# Patient Record
Sex: Male | Born: 1980 | Race: White | Hispanic: No | Marital: Single | Smoking: Former smoker
Health system: Southern US, Community
[De-identification: ages and names within clinical notes are randomized; demographics above are authoritative.]

## PROBLEM LIST (undated history)

## (undated) DIAGNOSIS — M109 Gout, unspecified: Secondary | ICD-10-CM

## (undated) DIAGNOSIS — F17200 Nicotine dependence, unspecified, uncomplicated: Secondary | ICD-10-CM

## (undated) DIAGNOSIS — K529 Noninfective gastroenteritis and colitis, unspecified: Secondary | ICD-10-CM

## (undated) DIAGNOSIS — Z8489 Family history of other specified conditions: Secondary | ICD-10-CM

## (undated) HISTORY — DX: Nicotine dependence, unspecified, uncomplicated: F17.200

## (undated) HISTORY — DX: Gout, unspecified: M10.9

## (undated) HISTORY — DX: Noninfective gastroenteritis and colitis, unspecified: K52.9

---

## 1999-03-19 ENCOUNTER — Emergency Department (HOSPITAL_COMMUNITY): Admission: EM | Admit: 1999-03-19 | Discharge: 1999-03-19 | Payer: Self-pay | Admitting: Emergency Medicine

## 1999-03-21 ENCOUNTER — Emergency Department (HOSPITAL_COMMUNITY): Admission: EM | Admit: 1999-03-21 | Discharge: 1999-03-21 | Payer: Self-pay | Admitting: Emergency Medicine

## 2004-02-12 ENCOUNTER — Emergency Department: Payer: Self-pay | Admitting: Emergency Medicine

## 2004-10-11 ENCOUNTER — Emergency Department (HOSPITAL_COMMUNITY): Admission: EM | Admit: 2004-10-11 | Discharge: 2004-10-11 | Payer: Self-pay | Admitting: Emergency Medicine

## 2009-01-15 ENCOUNTER — Emergency Department (HOSPITAL_BASED_OUTPATIENT_CLINIC_OR_DEPARTMENT_OTHER): Admission: EM | Admit: 2009-01-15 | Discharge: 2009-01-16 | Payer: Self-pay | Admitting: Emergency Medicine

## 2009-01-15 ENCOUNTER — Ambulatory Visit: Payer: Self-pay | Admitting: Diagnostic Radiology

## 2010-07-02 ENCOUNTER — Emergency Department (HOSPITAL_COMMUNITY)
Admission: EM | Admit: 2010-07-02 | Discharge: 2010-07-03 | Disposition: A | Discharge status: Home / Self Care | Payer: Self-pay | Attending: Emergency Medicine | Admitting: Emergency Medicine

## 2010-07-02 ENCOUNTER — Emergency Department (HOSPITAL_COMMUNITY): Payer: Self-pay

## 2010-07-02 DIAGNOSIS — M25579 Pain in unspecified ankle and joints of unspecified foot: Secondary | ICD-10-CM | POA: Insufficient documentation

## 2010-07-02 DIAGNOSIS — M25473 Effusion, unspecified ankle: Secondary | ICD-10-CM | POA: Insufficient documentation

## 2010-07-02 DIAGNOSIS — M25476 Effusion, unspecified foot: Secondary | ICD-10-CM | POA: Insufficient documentation

## 2010-07-02 DIAGNOSIS — R609 Edema, unspecified: Secondary | ICD-10-CM | POA: Insufficient documentation

## 2010-07-02 DIAGNOSIS — Z862 Personal history of diseases of the blood and blood-forming organs and certain disorders involving the immune mechanism: Secondary | ICD-10-CM | POA: Insufficient documentation

## 2010-07-02 DIAGNOSIS — Z8639 Personal history of other endocrine, nutritional and metabolic disease: Secondary | ICD-10-CM | POA: Insufficient documentation

## 2010-08-08 LAB — DIFFERENTIAL
Eosinophils Absolute: 0 10*3/uL (ref 0.0–0.7)
Lymphs Abs: 1.3 10*3/uL (ref 0.7–4.0)
Monocytes Absolute: 2.3 10*3/uL — ABNORMAL HIGH (ref 0.1–1.0)
Neutrophils Relative %: 85 % — ABNORMAL HIGH (ref 43–77)

## 2010-08-08 LAB — CBC
MCHC: 34 g/dL (ref 30.0–36.0)
RDW: 14.4 % (ref 11.5–15.5)

## 2010-08-08 LAB — BASIC METABOLIC PANEL
BUN: 12 mg/dL (ref 6–23)
CO2: 28 mEq/L (ref 19–32)
Calcium: 9.5 mg/dL (ref 8.4–10.5)
Creatinine, Ser: 1.4 mg/dL (ref 0.4–1.5)
Glucose, Bld: 111 mg/dL — ABNORMAL HIGH (ref 70–99)

## 2011-11-20 ENCOUNTER — Emergency Department (HOSPITAL_COMMUNITY): Payer: BC Managed Care – PPO

## 2011-11-20 ENCOUNTER — Emergency Department (HOSPITAL_COMMUNITY)
Admission: EM | Admit: 2011-11-20 | Discharge: 2011-11-20 | Disposition: A | Discharge status: Home / Self Care | Payer: BC Managed Care – PPO | Attending: Emergency Medicine | Admitting: Emergency Medicine

## 2011-11-20 ENCOUNTER — Encounter (HOSPITAL_COMMUNITY): Payer: Self-pay

## 2011-11-20 DIAGNOSIS — G8929 Other chronic pain: Secondary | ICD-10-CM | POA: Insufficient documentation

## 2011-11-20 DIAGNOSIS — R197 Diarrhea, unspecified: Secondary | ICD-10-CM | POA: Insufficient documentation

## 2011-11-20 DIAGNOSIS — R509 Fever, unspecified: Secondary | ICD-10-CM

## 2011-11-20 DIAGNOSIS — R109 Unspecified abdominal pain: Secondary | ICD-10-CM | POA: Insufficient documentation

## 2011-11-20 LAB — CBC WITH DIFFERENTIAL/PLATELET
Basophils Absolute: 0 10*3/uL (ref 0.0–0.1)
Basophils Relative: 0 % (ref 0–1)
Eosinophils Relative: 1 % (ref 0–5)
Lymphocytes Relative: 10 % — ABNORMAL LOW (ref 12–46)
MCHC: 33.4 g/dL (ref 30.0–36.0)
MCV: 87.8 fL (ref 78.0–100.0)
Platelets: 307 10*3/uL (ref 150–400)
RDW: 13.8 % (ref 11.5–15.5)
WBC: 12.4 10*3/uL — ABNORMAL HIGH (ref 4.0–10.5)

## 2011-11-20 LAB — LACTIC ACID, PLASMA: Lactic Acid, Venous: 1.7 mmol/L (ref 0.5–2.2)

## 2011-11-20 LAB — COMPREHENSIVE METABOLIC PANEL
ALT: 25 U/L (ref 0–53)
AST: 22 U/L (ref 0–37)
Albumin: 3.9 g/dL (ref 3.5–5.2)
CO2: 24 mEq/L (ref 19–32)
Calcium: 9.3 mg/dL (ref 8.4–10.5)
Creatinine, Ser: 1.05 mg/dL (ref 0.50–1.35)
GFR calc non Af Amer: >90 mL/min (ref >90)
Sodium: 135 mEq/L (ref 135–145)
Total Protein: 7.6 g/dL (ref 6.0–8.3)

## 2011-11-20 MED ORDER — HYDROCODONE-ACETAMINOPHEN 5-500 MG PO TABS: 1.0000 | ORAL_TABLET | Freq: Four times a day (QID) | ORAL | Status: DC | PRN

## 2011-11-20 MED ORDER — IOHEXOL 300 MG/ML  SOLN
100.0000 mL | Freq: Once | INTRAMUSCULAR | Status: AC | PRN
  Administered 2011-11-20: 100 mL via INTRAVENOUS

## 2011-11-20 MED ORDER — ACETAMINOPHEN 500 MG PO TABS
1000.0000 mg | ORAL_TABLET | Freq: Once | ORAL | Status: AC
  Administered 2011-11-20: 1000 mg via ORAL
  Filled 2011-11-20: qty 2

## 2011-11-20 MED ORDER — METOCLOPRAMIDE HCL 10 MG PO TABS: 10.0000 mg | ORAL_TABLET | Freq: Four times a day (QID) | ORAL | Status: DC | PRN

## 2011-11-20 MED ORDER — IOHEXOL 300 MG/ML  SOLN
20.0000 mL | INTRAMUSCULAR | Status: AC
  Administered 2011-11-20 (×2): 20 mL via ORAL

## 2011-11-20 MED ORDER — SODIUM CHLORIDE 0.9 % IV BOLUS (SEPSIS)
1000.0000 mL | Freq: Once | INTRAVENOUS | Status: AC
  Administered 2011-11-20: 1000 mL via INTRAVENOUS

## 2011-11-20 NOTE — ED Notes (Signed)
Pt transported to and from CT scanner on stretcher with tech and tolerated well. 

## 2011-11-20 NOTE — ED Provider Notes (Signed)
History     CSN: [id number]  Arrival date & time 11/20/11  0102   First MD Initiated Contact with Patient 11/20/11 [phone]      Chief Complaint  Patient presents with  . Rectal Bleeding    (Consider location/radiation/quality/duration/timing/severity/associated sxs/prior treatment) HPI The patient presents with concerns of ongoing rectal bleeding and diarrhea.  He notes that for the past years he has had diarrhea.  Over the past 3 days his progressively become fatigued with diffuse discomfort.  He denies focal pain.  He states that he has had a change in his bowel movements from just diarrhea to play diarrhea.  He denies ongoing fevers, chills, lightheadedness, chest pain, dyspnea. The patient has never seen a gastroenterologist for his chronic diarrhea.  No past medical history on file.  No past surgical history on file.  No family history on file.  History  Substance Use Topics  . Smoking status: Never Smoker   . Smokeless tobacco: Not on file  . Alcohol Use: No      Review of Systems  Constitutional:       Per HPI, otherwise negative  HENT:       Per HPI, otherwise negative  Eyes: Negative.   Respiratory:       Per HPI, otherwise negative  Cardiovascular:       Per HPI, otherwise negative  Gastrointestinal: Negative for vomiting.  Genitourinary: Negative.   Musculoskeletal:       Per HPI, otherwise negative  Skin: Negative.   Neurological: Negative for syncope.    Allergies  Review of patient's allergies indicates no known allergies.  Home Medications   Current Outpatient Rx  Name Route Sig Dispense Refill  . HYDROCODONE-ACETAMINOPHEN 5-500 MG PO TABS Oral Take 1 tablet by mouth every 6 (six) hours as needed. pain    . MELOXICAM 7.5 MG PO TABS Oral Take 7.5 mg by mouth daily.    Marland Kitchen PHENYLEPHRINE-DM-GG-APAP 5-10-200-325 MG PO TABS Oral Take 1-2 tablets by mouth every 6 (six) hours as needed. Cold and cough.      BP 129/65  Pulse 90  Temp 99.8 F (37.7  C) (Oral)  Resp 18  SpO2 98%  Physical Exam  Nursing note and vitals reviewed. Constitutional: He is oriented to person, place, and time. He appears well-developed. No distress.  HENT:  Head: Normocephalic and atraumatic.  Eyes: Conjunctivae and EOM are normal.  Cardiovascular: Normal rate and regular rhythm.   Pulmonary/Chest: Effort normal. No stridor. No respiratory distress.  Abdominal: He exhibits no distension.  Genitourinary: Testes normal. Rectal exam shows external hemorrhoid. Guaiac negative stool.       Empty rectal vault, no material for Hemoccult.  No gross bleeding  Musculoskeletal: He exhibits no edema.  Neurological: He is alert and oriented to person, place, and time.  Skin: Skin is warm and dry.  Psychiatric: He has a normal mood and affect.    ED Course  Procedures (including critical care time)   Labs Reviewed  CBC WITH DIFFERENTIAL  COMPREHENSIVE METABOLIC PANEL  LIPASE, BLOOD  LACTIC ACID, PLASMA   No results found.   No diagnosis found.  Patient was sleeping on multiple re-evals.  He deferred requests for additional anti-emetics.   Pulse ox99%ra, normal  MDM  This generally well-appearing young male presents with concerns of ongoing abdominal pain, nausea, blood in his stool.  On my exam the patient is in no distress, awake, alert, interactive.  The patient does have a tender abdomen.  Given  the patient's description of years of diarrhea there is a suspicion of of irritable bowel disease.  The patient was sleeping following initial provision of antiemetics, analgesics.  The patient did not wish to drink his contrast material.  Noncontrast CT did not demonstrate acute findings.  The patient has a mild leukocytosis, mild fever, but is otherwise well appearing.  We discussed the need for close follow-up, with both PMD and GI to complete his evaluation of his acute on chronic abdominal pain and chronic diarrhea.    Gerhard Munch, MD 11/20/11  [phone]

## 2011-11-20 NOTE — ED Notes (Signed)
Body pain from head to toe, diarrhea now with blood, feels weak and has headache.

## 2011-11-20 NOTE — ED Notes (Addendum)
Pt attempting to drink contrast and refuses the remainder, states "it's not going to happen, I'll throw it all up".  Antiemetic medication offered, pt declined.

## 2011-11-20 NOTE — ED Notes (Signed)
Pt requesting to have strep screen st's he has had a sore throat x's 2 days.  Dr. Juleen China notified st's will order this.  Pt then refuse to let me swab his throat st's it will make him gag.  Dr. Juleen China made aware and rapid strep screen cancelled and pt discharged.

## 2011-11-24 ENCOUNTER — Encounter: Payer: Self-pay | Admitting: Internal Medicine

## 2011-11-25 ENCOUNTER — Encounter: Payer: Self-pay | Admitting: Internal Medicine

## 2011-11-25 ENCOUNTER — Other Ambulatory Visit (INDEPENDENT_AMBULATORY_CARE_PROVIDER_SITE_OTHER): Payer: BC Managed Care – PPO

## 2011-11-25 ENCOUNTER — Ambulatory Visit (INDEPENDENT_AMBULATORY_CARE_PROVIDER_SITE_OTHER): Payer: BC Managed Care – PPO | Admitting: Internal Medicine

## 2011-11-25 VITALS — BP 108/72 | HR 80 | Ht 68.0 in | Wt 189.0 lb

## 2011-11-25 DIAGNOSIS — K219 Gastro-esophageal reflux disease without esophagitis: Secondary | ICD-10-CM

## 2011-11-25 DIAGNOSIS — R197 Diarrhea, unspecified: Secondary | ICD-10-CM

## 2011-11-25 DIAGNOSIS — R109 Unspecified abdominal pain: Secondary | ICD-10-CM

## 2011-11-25 DIAGNOSIS — M109 Gout, unspecified: Secondary | ICD-10-CM | POA: Insufficient documentation

## 2011-11-25 DIAGNOSIS — K921 Melena: Secondary | ICD-10-CM

## 2011-11-25 LAB — IGA: IgA: 215 mg/dL (ref 68–378)

## 2011-11-25 LAB — TSH: TSH: 3.51 u[IU]/mL (ref 0.35–5.50)

## 2011-11-25 MED ORDER — ESOMEPRAZOLE MAGNESIUM 40 MG PO PACK: 40.0000 mg | PACK | Freq: Every day | ORAL | Status: DC

## 2011-11-25 NOTE — Progress Notes (Signed)
Patient ID: Robert Francis, male   DOB: 1981-02-04, 31 y.o.   MRN: [id number]  SUBJECTIVE: HPI Robert Francis is a 31 yo male with PMH of gout who is seen in consultation at the request of Dr. Prince Francis for evaluation of epigastric abdominal pain and diarrhea. The patient reports that he's had long-standing diarrhea and loose stools. This dates back greater than 5 years. He reports 5-6 loose stools a day, which are worse after eating. This is often associated with lower abdominal cramping which is relieved with defecation. A new problem for him was recently he experienced several days of blood mixed in his diarrhea. This has subsequently resolved. He recalls seeing blood on one occasion but this was remote. Occasionally the diarrhea wakes him from sleep but this is not common. He reports significant fecal urgency, but denies tenesmus. He also reports epigastric abdominal pain which is a near daily thing for him. He's also experiencing heartburn 3-4 days per week. He reports significant gas and bloating for which he has used simethicone with some relief. He does occasionally have nausea without vomiting. No dysphagia or odynophagia. Belching is not a big problem for him, though he feels that if he was able to belch it may help his pain. No fevers or chills recently, but a week or so ago he reports low-grade fevers.  He was recently seen in near and had a CT scan. He was prescribed metoclopramide for nausea but has not taken this.  Review of Systems  As per history of present illness, otherwise negative   Past Medical History  Diagnosis Date  . Gout     Current Outpatient Prescriptions  Medication Sig Dispense Refill  . HYDROcodone-acetaminophen (VICODIN) 5-500 MG per tablet Take 1 tablet by mouth every 6 (six) hours as needed. pain  12 tablet  0  . esomeprazole (NEXIUM) 40 MG packet Take 40 mg by mouth daily before breakfast.  30 each  12    No Known Allergies  Family History  Problem Relation Age of  Onset  . Colon cancer Neg Hx     History  Substance Use Topics  . Smoking status: Former Games developer  . Smokeless tobacco: Never Used  . Alcohol Use: No     Quit     OBJECTIVE: BP 108/72  Pulse 80  Ht 5\' 8"  (1.727 m)  Wt 189 lb (85.73 kg)  BMI 28.74 kg/m2 Constitutional: Well-developed and well-nourished. No distress. HEENT: Normocephalic and atraumatic. Oropharynx is clear and moist. No oropharyngeal exudate. Conjunctivae are normal. Pupils are equal round and reactive to light. No scleral icterus. Neck: Neck supple. Trachea midline. Cardiovascular: Normal rate, regular rhythm and intact distal pulses. No M/R/G Pulmonary/chest: Effort normal and breath sounds normal. No wheezing, rales or rhonchi. Abdominal: Soft, nontender, nondistended. Bowel sounds active throughout. There are no masses palpable. No hepatosplenomegaly. Extremities: no clubbing, cyanosis, or edema Lymphadenopathy: No cervical adenopathy noted. Neurological: Alert and oriented to person place and time. Skin: Skin is warm and dry. No rashes noted. Psychiatric: Normal mood and affect. Behavior is normal.  Labs and Imaging -- CT scan 11/20/2011 CT ABDOMEN AND PELVIS WITH CONTRAST   Technique:  Multidetector CT imaging of the abdomen and pelvis was performed following the standard protocol during bolus administration of intravenous contrast.   Contrast:  100 ml Omnipaque-300 IV   Comparison: None.   Findings: Mild dependent atelectasis/mosaic attenuation at the lung bases.   Liver, spleen, pancreas, and adrenal glands are within normal limits.   Gallbladder  is underdistended.  No intrahepatic or extrahepatic ductal dilatation.   Bilateral renal cysts, largest measuring 1.5 cm in the right upper pole (series 2/image 30). Scattered areas of high density in the left > right renal collecting systems is favored to reflect early excretory contrast rather than nonobstructing calculi (series 2/image 33).  No  hydronephrosis.   No evidence of bowel obstruction.  Normal appendix.  No colonic wall thickening or inflammatory changes.   No evidence of abdominal aortic aneurysm.   No abdominopelvic ascites.   No suspicious abdominopelvic lymphadenopathy.   Prostate is unremarkable.   Bladder is within normal limits.   Small fat-containing left inguinal hernia.   Visualized osseous structures are within normal limits.   IMPRESSION: No colonic wall thickening or inflammatory changes.   No evidence of bowel obstruction.  Normal appendix.   No CT findings to account for the patient's abdominal pain.  CBC    Component Value Date/Time   WBC 12.4* 11/20/2011 1027   RBC 5.01 11/20/2011 1027   HGB 14.7 11/20/2011 1027   HCT 44.0 11/20/2011 1027   PLT 307 11/20/2011 1027   MCV 87.8 11/20/2011 1027   MCH 29.3 11/20/2011 1027   MCHC 33.4 11/20/2011 1027   RDW 13.8 11/20/2011 1027   LYMPHSABS 1.2 11/20/2011 1027   MONOABS 1.4* 11/20/2011 1027   EOSABS 0.2 11/20/2011 1027   BASOSABS 0.0 11/20/2011 1027    CMP     Component Value Date/Time   NA 135 11/20/2011 1027   K 4.6 11/20/2011 1027   CL 98 11/20/2011 1027   CO2 24 11/20/2011 1027   GLUCOSE 127* 11/20/2011 1027   BUN 8 11/20/2011 1027   CREATININE 1.05 11/20/2011 1027   CALCIUM 9.3 11/20/2011 1027   PROT 7.6 11/20/2011 1027   ALBUMIN 3.9 11/20/2011 1027   AST 22 11/20/2011 1027   ALT 25 11/20/2011 1027   ALKPHOS 104 11/20/2011 1027   BILITOT 0.2* 11/20/2011 1027   GFRNONAA >90 11/20/2011 1027   GFRAA >90 11/20/2011 1027    Lipase     Component Value Date/Time   LIPASE 28 11/20/2011 1027   ASSESSMENT AND PLAN: 31 yo male with PMH of gout who is seen in consultation at the request of Dr. Prince Francis for evaluation of epigastric abdominal pain and diarrhea.  1. Diarrhea/bloody diarrhea/lower abd pain --the patient has long-standing history of diarrhea and loose stools, however recently experienced several days of bloody diarrhea. He has had CT scan  which was unremarkable and this is reassuring. He did have a mild leukocytosis notable on CBC. CMP was rather unremarkable. First I would like to exclude infection as a source for his diarrhea. We have ordered stool studies today to include C. difficile, O. and P., fecal leukocytes and stool culture. I also would like to check a celiac panel and TSH. If this workup is unrevealing, then I have recommended proceeding with colonoscopy for further evaluation of his diarrhea and bleeding. If infectious workup is negative we can try loperamide as an antidiarrheal.  2. Epigastric pain/GERD/dyspepsia -- I'll prescribe Nexium 40 mg daily to help with his symptoms. If symptoms fail to respond completely with PPI, then we will likely pursue upper endoscopy at the same time as his colonoscopy. I've asked that he discontinue metoclopramide this likely will not help his diarrhea

## 2011-11-25 NOTE — Patient Instructions (Addendum)
Your physician has requested that you go to the basement for lab work before leaving today.  We have sent the following medications to your pharmacy for you to pick up at your convenience: Nexium  Discontinue Reglan  Upon review of your labs we will discuss colonoscopy

## 2011-11-26 LAB — TISSUE TRANSGLUTAMINASE, IGA: Tissue Transglutaminase Ab, IgA: 4.7 U/mL (ref <20)

## 2013-05-04 HISTORY — PX: WISDOM TOOTH EXTRACTION: SHX21

## 2014-04-08 IMAGING — CT CT ABD-PELV W/ CM
2 of 4 series · 17 of 46 positions shown, 19 images · IV contrast (100ml omni 300)
Comparison: None.

CLINICAL DATA: Abdominal pain, nausea, possible colitis

CT ABDOMEN AND PELVIS WITH CONTRAST
TECHNIQUE: Multidetector CT imaging of the abdomen and pelvis was
performed following the standard protocol during bolus
administration of intravenous contrast.
Contrast:  100 ml Imnipaque-VLL IV

[Series 2: routine abdomen · axial · 0.87mm/px · z∈[-472,-17]mm · 14 of 99 slices shown, 16 images]
[im 4/99  soft-tissue]
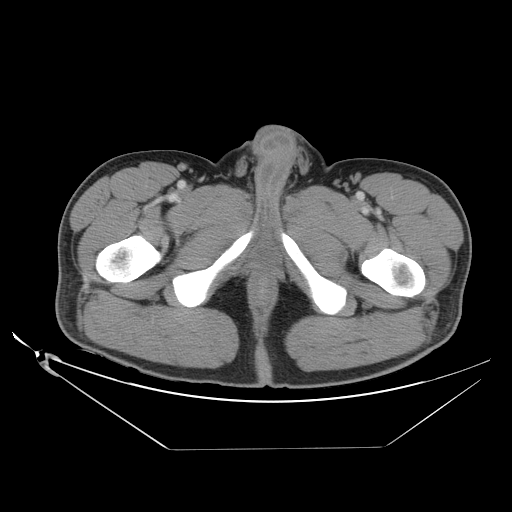
[im 4/99  bone]
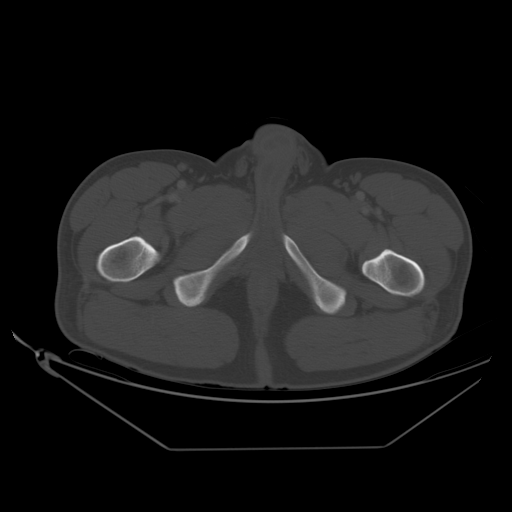
[im 12/99  soft-tissue]
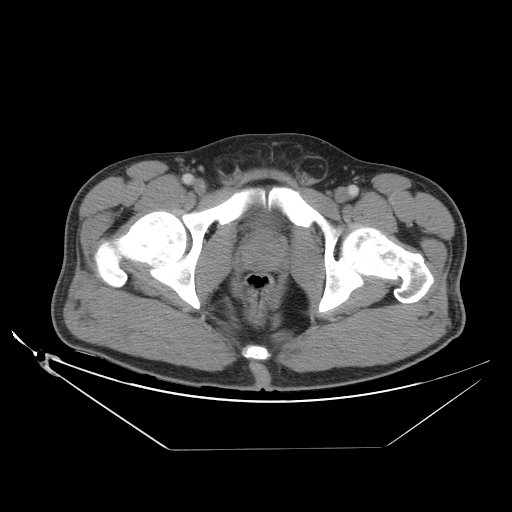
[im 20/99  soft-tissue]
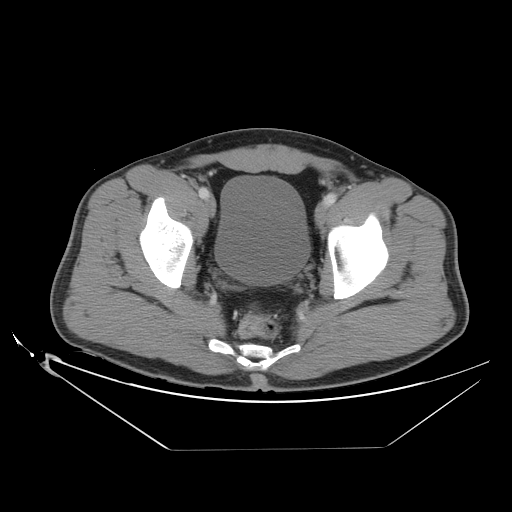
[im 28/99  soft-tissue]
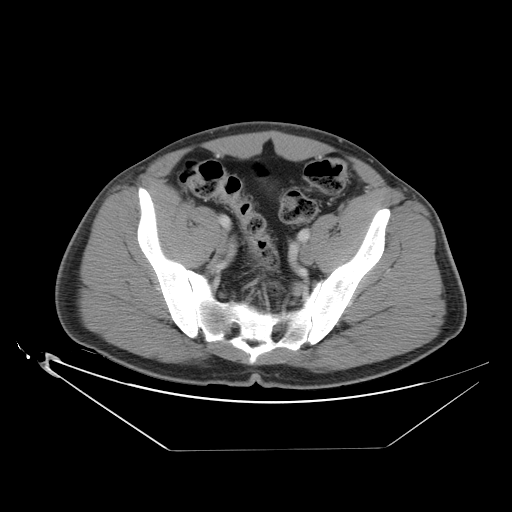
[im 32/99  soft-tissue]
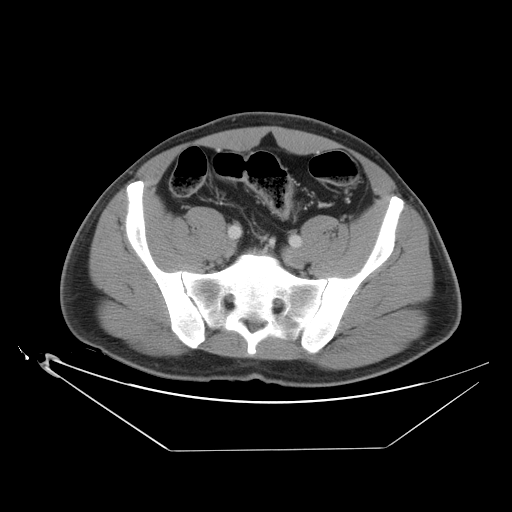
[im 40/99  soft-tissue]
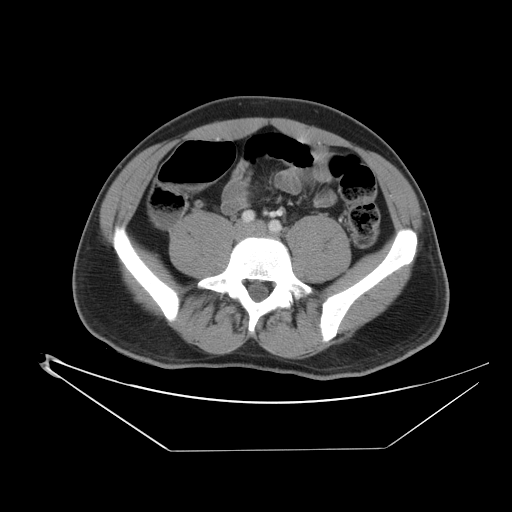
[im 48/99  soft-tissue]
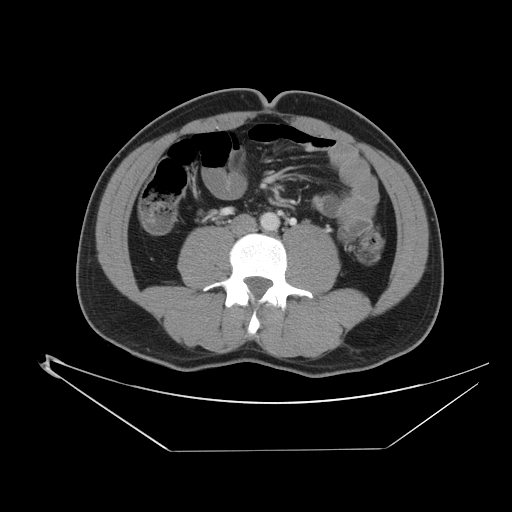
[im 51/99  soft-tissue]
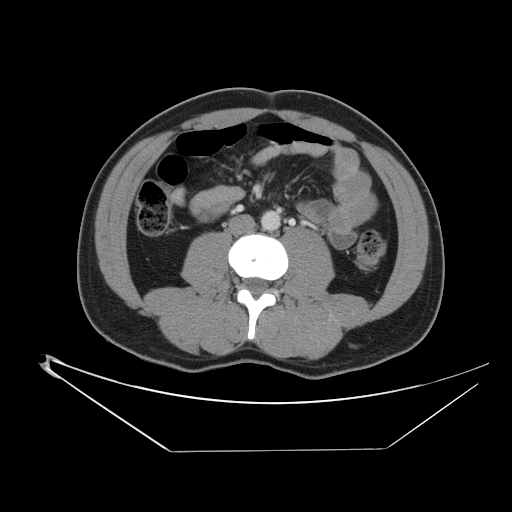
[im 59/99  soft-tissue]
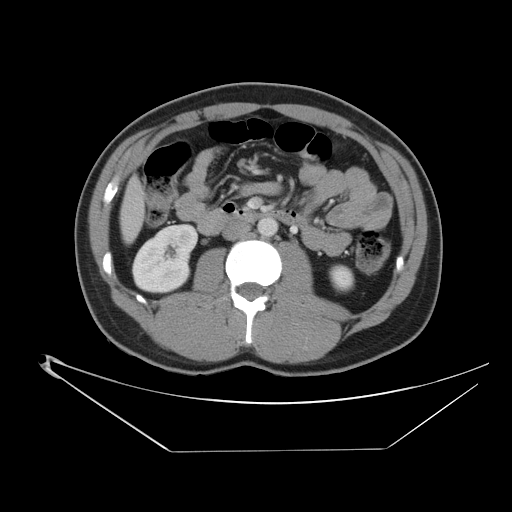
[im 59/99  bone]
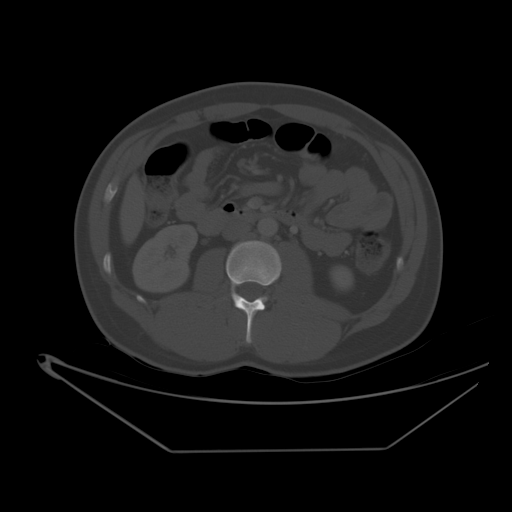
[im 67/99  soft-tissue]
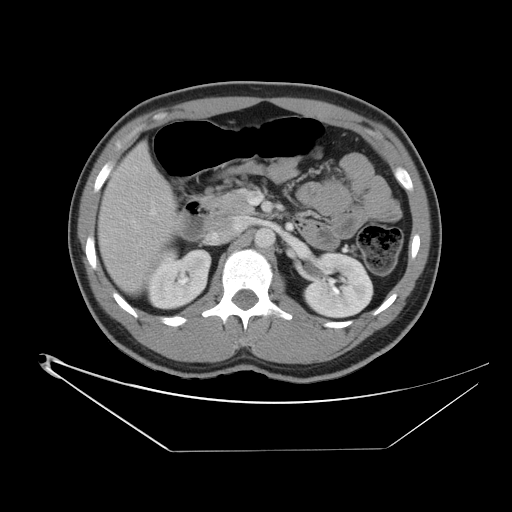
[im 75/99  soft-tissue]
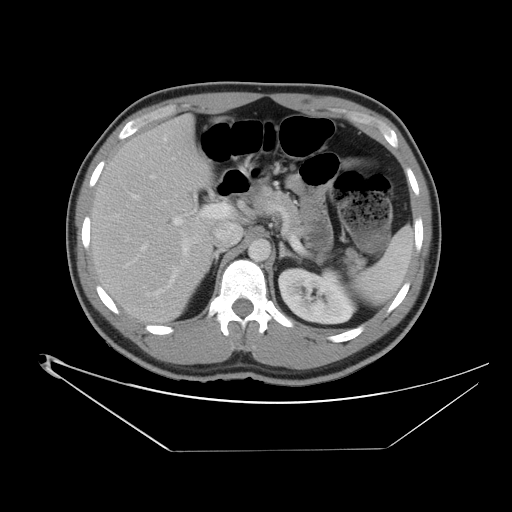
[im 79/99  soft-tissue]
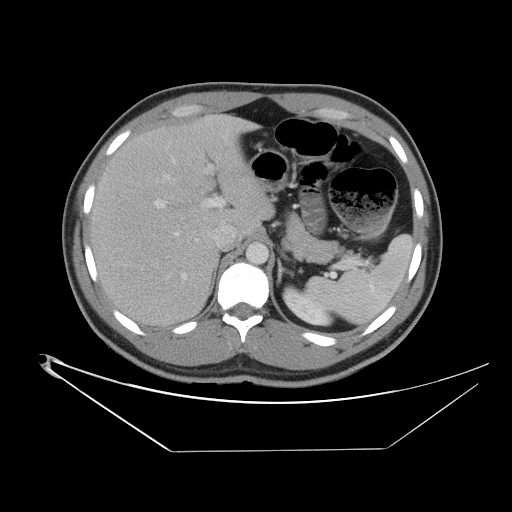
[im 87/99  soft-tissue]
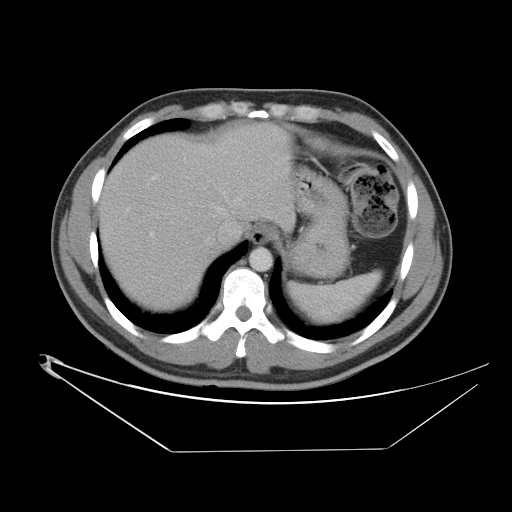
[im 95/99  soft-tissue]
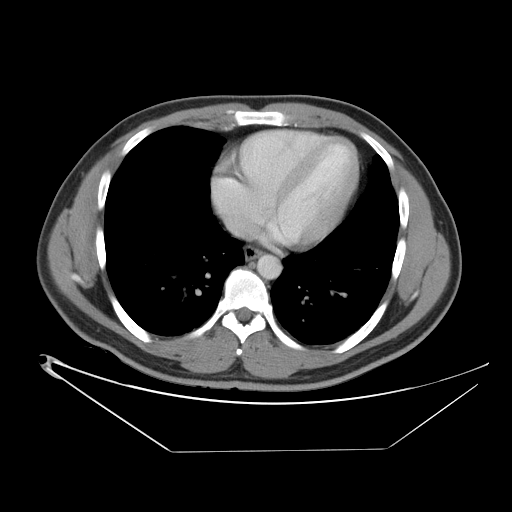

[Series 400: cor · coronal · 0.98mm/px · 3 of 86 slices shown]
[im 29/86  soft-tissue]
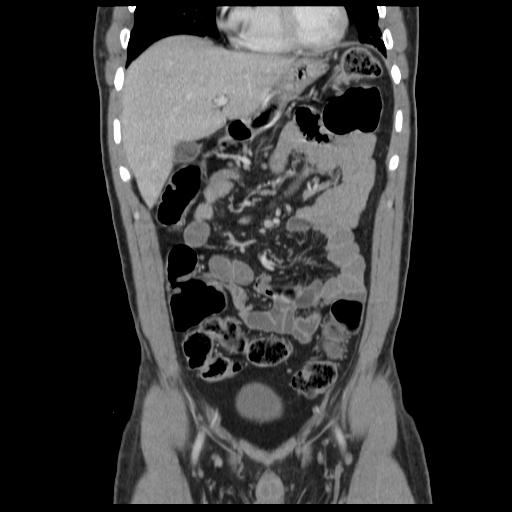
[im 38/86  soft-tissue]
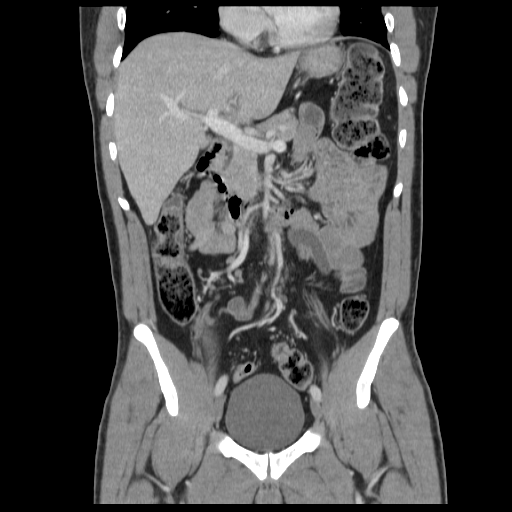
[im 48/86  soft-tissue]
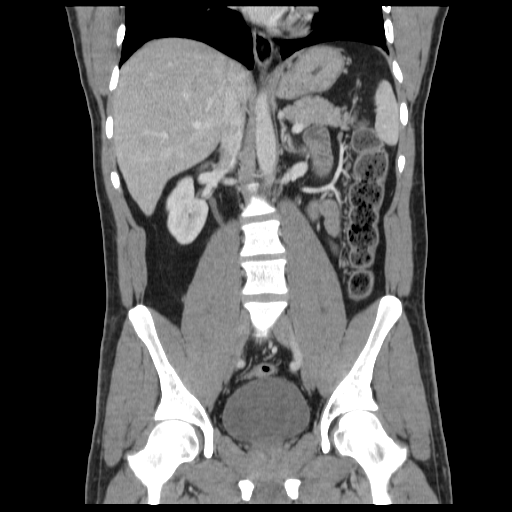

[17 of 46 positions shown; findings below may reference images not displayed]

FINDINGS: Mild dependent atelectasis/mosaic attenuation at the lung
bases.

Liver, spleen, pancreas, and adrenal glands are within normal
limits.

Gallbladder is underdistended.  No intrahepatic or extrahepatic
ductal dilatation.

Bilateral renal cysts, largest measuring 1.5 cm in the right upper
pole (series 2/image 30). Scattered areas of high density in the
left > right renal collecting systems is favored to reflect early
excretory contrast rather than nonobstructing calculi (series
2/image 33).  No hydronephrosis.

No evidence of bowel obstruction.  Normal appendix.  No colonic
wall thickening or inflammatory changes.

No evidence of abdominal aortic aneurysm.

No abdominopelvic ascites.

No suspicious abdominopelvic lymphadenopathy.

Prostate is unremarkable.

Bladder is within normal limits.

Small fat-containing left inguinal hernia.

Visualized osseous structures are within normal limits.
IMPRESSION: No colonic wall thickening or inflammatory changes.

No evidence of bowel obstruction.  Normal appendix.

No CT findings to account for the patient's abdominal pain.

## 2014-05-31 ENCOUNTER — Encounter: Payer: Self-pay | Admitting: *Deleted

## 2014-06-21 ENCOUNTER — Ambulatory Visit (INDEPENDENT_AMBULATORY_CARE_PROVIDER_SITE_OTHER): Payer: BLUE CROSS/BLUE SHIELD | Admitting: Family Medicine

## 2014-06-21 ENCOUNTER — Encounter: Payer: Self-pay | Admitting: Family Medicine

## 2014-06-21 VITALS — BP 130/72 | HR 78 | Temp 98.3°F | Resp 16 | Ht 68.0 in | Wt 189.0 lb

## 2014-06-21 DIAGNOSIS — K529 Noninfective gastroenteritis and colitis, unspecified: Secondary | ICD-10-CM

## 2014-06-21 DIAGNOSIS — M1000 Idiopathic gout, unspecified site: Secondary | ICD-10-CM

## 2014-06-21 DIAGNOSIS — Z0000 Encounter for general adult medical examination without abnormal findings: Secondary | ICD-10-CM

## 2014-06-21 DIAGNOSIS — F17200 Nicotine dependence, unspecified, uncomplicated: Secondary | ICD-10-CM | POA: Insufficient documentation

## 2014-06-21 LAB — CBC WITH DIFFERENTIAL/PLATELET
BASOS ABS: 0 10*3/uL (ref 0.0–0.1)
BASOS PCT: 0 % (ref 0–1)
EOS ABS: 0.2 10*3/uL (ref 0.0–0.7)
EOS PCT: 2 % (ref 0–5)
HCT: 43.1 % (ref 39.0–52.0)
Hemoglobin: 14.3 g/dL (ref 13.0–17.0)
Lymphocytes Relative: 13 % (ref 12–46)
Lymphs Abs: 1.5 10*3/uL (ref 0.7–4.0)
MCH: 26.8 pg (ref 26.0–34.0)
MCHC: 33.2 g/dL (ref 30.0–36.0)
MCV: 80.9 fL (ref 78.0–100.0)
MONO ABS: 1.4 10*3/uL — AB (ref 0.1–1.0)
MONOS PCT: 12 % (ref 3–12)
MPV: 9.4 fL (ref 8.6–12.4)
Neutro Abs: 8.2 10*3/uL — ABNORMAL HIGH (ref 1.7–7.7)
Neutrophils Relative %: 73 % (ref 43–77)
PLATELETS: 463 10*3/uL — AB (ref 150–400)
RBC: 5.33 MIL/uL (ref 4.22–5.81)
RDW: 16.4 % — ABNORMAL HIGH (ref 11.5–15.5)
WBC: 11.3 10*3/uL — AB (ref 4.0–10.5)

## 2014-06-21 LAB — COMPLETE METABOLIC PANEL WITH GFR
ALT: 18 U/L (ref 0–53)
AST: 14 U/L (ref 0–37)
Albumin: 3.9 g/dL (ref 3.5–5.2)
Alkaline Phosphatase: 103 U/L (ref 39–117)
BILIRUBIN TOTAL: 0.4 mg/dL (ref 0.2–1.2)
BUN: 10 mg/dL (ref 6–23)
CO2: 21 meq/L (ref 19–32)
CREATININE: 1.04 mg/dL (ref 0.50–1.35)
Calcium: 9 mg/dL (ref 8.4–10.5)
Chloride: 105 mEq/L (ref 96–112)
GFR, Est African American: >89 mL/min
GFR, Est Non African American: >89 mL/min
GLUCOSE: 79 mg/dL (ref 70–99)
Potassium: 5.1 mEq/L (ref 3.5–5.3)
Sodium: 139 mEq/L (ref 135–145)
TOTAL PROTEIN: 6.7 g/dL (ref 6.0–8.3)

## 2014-06-21 LAB — LIPID PANEL
Cholesterol: 191 mg/dL (ref 0–200)
HDL: 50 mg/dL (ref >39)
LDL CALC: 125 mg/dL — AB (ref 0–99)
TRIGLYCERIDES: 81 mg/dL (ref <150)
Total CHOL/HDL Ratio: 3.8 Ratio
VLDL: 16 mg/dL (ref 0–40)

## 2014-06-21 LAB — URIC ACID: Uric Acid, Serum: 9.2 mg/dL — ABNORMAL HIGH (ref 4.0–7.8)

## 2014-06-21 NOTE — Progress Notes (Signed)
Subjective:    Patient ID: Robert Francis, male    DOB: 1980-09-23, 34 y.o.   MRN: [id number]  HPI Patient is here today to establish care. He is also requesting a general medical exam. He has 2 chronic issues. #1 he has a history of gout. Patient has several gout exacerbations every year. He takes colchicine on an as needed basis which seems to work well. He would like a refill on colchicine as needed. He has never been tried on a preventative medicine even though he has frequent gout attacks. He also has a history of chronic diarrhea for several years. He was seen by gastroenterology in 2013 and at that time a celiac panel was negative, TSH was normal, and a CT scan was unremarkable. They have recommended a colonoscopy to rule out inflammatory bowel disease but this was apparently never done. He continues to have 5-7 watery bowel movements every day with occasional blood in his stool. He has no family history of inflammatory bowel disease. He denies any fevers or chills or weight loss or flulike symptoms. He also has a significant past medical history of smoking along with his significant family history of premature coronary artery disease. Past Medical History  Diagnosis Date  . Gout   . Smoker    No past surgical history on file. No current outpatient prescriptions on file prior to visit.   No current facility-administered medications on file prior to visit.   No Known Allergies History   Social History  . Marital Status: Single    Spouse Name: N/A  . Number of Children: N/A  . Years of Education: N/A   Occupational History  . Not on file.   Social History Main Topics  . Smoking status: Current Every Day Smoker -- 1.00 packs/day    Types: Cigarettes  . Smokeless tobacco: Never Used  . Alcohol Use: Yes     Comment: rarely  . Drug Use: No  . 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 Not on  file   Social History Narrative   Family History  Problem Relation Age of Onset  . Colon cancer Neg Hx   . Hypertension Mother   . Heart disease Maternal Uncle     40's  . Cancer Maternal Grandmother     breast  . Heart disease Maternal Grandfather     73's  . Cancer Paternal Grandfather     brain tumor      Review of Systems  All other systems reviewed and are negative.      Objective:   Physical Exam  Constitutional: He is oriented to person, place, and time. He appears well-developed and well-nourished. No distress.  HENT:  Head: Normocephalic and atraumatic.  Right Ear: External ear normal.  Left Ear: External ear normal.  Nose: Nose normal.  Mouth/Throat: Oropharynx is clear and moist. No oropharyngeal exudate.  Eyes: Conjunctivae and EOM are normal. Pupils are equal, round, and reactive to light. Right eye exhibits no discharge. Left eye exhibits no discharge. No scleral icterus.  Neck: Normal range of motion. Neck supple. No JVD present. No tracheal deviation present. No thyromegaly present.  Cardiovascular: Normal rate, regular rhythm, normal heart sounds and intact distal pulses.  Exam reveals no gallop and no friction rub.   No murmur heard. Pulmonary/Chest: Effort normal and breath sounds normal. No stridor. No respiratory distress. He has no wheezes. He has  no rales. He exhibits no tenderness.  Abdominal: Soft. Bowel sounds are normal. He exhibits no distension and no mass. There is no tenderness. There is no rebound and no guarding.  Musculoskeletal: Normal range of motion. He exhibits no edema or tenderness.  Lymphadenopathy:    He has no cervical adenopathy.  Neurological: He is alert and oriented to person, place, and time. He has normal reflexes. He displays normal reflexes. No cranial nerve deficit. He exhibits normal muscle tone. Coordination normal.  Skin: Skin is warm. No rash noted. He is not diaphoretic. No erythema. No pallor.  Psychiatric: He has  a normal mood and affect. His behavior is normal. Judgment and thought content normal.  Vitals reviewed.         Assessment & Plan:  Routine general medical examination at a health care facility - Plan: COMPLETE METABOLIC PANEL WITH GFR, CBC with Differential/Platelet, Lipid panel  Acute idiopathic gout, unspecified site - Plan: Uric acid  Colitis - Plan: Sedimentation rate  Chronic diarrhea  Physical exam is significant for smoking and tobacco abuse. I recommended cessation. I also believe the patient is having way too many gout exacerbations and this could lead to chronic joint problems later in life. I will check a uric acid level and if greater than 6 hour recommend starting allopurinol as it preventative medication. I'm fine giving the patient culture seen on an as needed basis for exacerbations but I believe we need to target and prevent exacerbations. I'm also concerned by his chronic diarrhea. I do think he needs to follow-up with GI for the colonoscopy to rule out inflammatory bowel disease. If the colonoscopy is negative at that time, we can focus treatment on irritable bowel syndrome diarrhea predominant with medications such as Xifaxan or Viberzi.

## 2014-06-22 ENCOUNTER — Other Ambulatory Visit: Payer: Self-pay | Admitting: Family Medicine

## 2014-06-22 ENCOUNTER — Telehealth: Payer: Self-pay | Admitting: Family Medicine

## 2014-06-22 DIAGNOSIS — E790 Hyperuricemia without signs of inflammatory arthritis and tophaceous disease: Secondary | ICD-10-CM

## 2014-06-22 LAB — SEDIMENTATION RATE: Sed Rate: 8 mm/hr (ref 0–15)

## 2014-06-22 MED ORDER — ALLOPURINOL 100 MG PO TABS: 200.0000 mg | ORAL_TABLET | Freq: Every day | ORAL | Status: DC

## 2014-06-22 MED ORDER — COLCHICINE 0.6 MG PO TABS: 0.6000 mg | ORAL_TABLET | Freq: Every day | ORAL | Status: DC

## 2014-06-22 NOTE — Telephone Encounter (Signed)
-----   Message from Donita BrooksWarren T Pickard, MD sent at 06/22/2014  7:12 AM EST ----- Cholesterol is slightly high.  Work on diet as we discussed.  Uric acid is very high.  I would suggest starting allopurinol 200 mg poqday and colchicine 0.6 mg poqday and recheck uric acid level in 6 weeks.

## 2014-06-22 NOTE — Telephone Encounter (Signed)
Pt aware of lab results and provider recommendations 

## 2014-06-25 ENCOUNTER — Other Ambulatory Visit: Payer: Self-pay | Admitting: Family Medicine

## 2014-06-25 MED ORDER — VARENICLINE TARTRATE 0.5 MG X 11 & 1 MG X 42 PO MISC: ORAL | Status: DC

## 2014-07-05 ENCOUNTER — Ambulatory Visit (INDEPENDENT_AMBULATORY_CARE_PROVIDER_SITE_OTHER): Payer: BLUE CROSS/BLUE SHIELD | Admitting: Gastroenterology

## 2014-07-05 ENCOUNTER — Encounter (HOSPITAL_COMMUNITY): Payer: Self-pay | Admitting: *Deleted

## 2014-07-05 ENCOUNTER — Encounter: Payer: Self-pay | Admitting: Gastroenterology

## 2014-07-05 VITALS — BP 120/70 | HR 95 | Ht 68.0 in | Wt 191.8 lb

## 2014-07-05 DIAGNOSIS — K625 Hemorrhage of anus and rectum: Secondary | ICD-10-CM

## 2014-07-05 DIAGNOSIS — R197 Diarrhea, unspecified: Secondary | ICD-10-CM | POA: Insufficient documentation

## 2014-07-05 DIAGNOSIS — R109 Unspecified abdominal pain: Secondary | ICD-10-CM

## 2014-07-05 NOTE — Patient Instructions (Signed)
We have scheduled your colonoscopy for 07/10/2014 at 10:30am Separate instructions have been given

## 2014-07-05 NOTE — Progress Notes (Addendum)
     07/05/2014 Almer Chachere [id number] 02/15/1981   History of Present Illness:  This is a 34 year old male who was previously seen by Dr. Pyrtle for evaluation of diarrhea, rectal bleeding, and abdominal pain in 11/2011.  Evaluation included CT scan of the abdomen and pelvis with contrast, which was unremarkable.    It was recommended that if symptoms did not improve and other lab evaluation was negative then next step would be colonoscopy.  He is here now to discuss scheduling that procedure.  He says that it is extremely hard to make medical appointments, etc due to his job, which is why it has taken him so long to come back to the office.  He reports continued diarrhea several times a day; says that it looks like muddy water and has not been solid in several years.  He does see some blood on occasion.  Has a lot of abdominal pain/cramping and gas.  He is taking colchicine and had been on this at some point in the past as well (says that it definitely worsens the diarrhea), but even all of the time that he was not taking it the diarrhea never went away and really did not even get much better.  Recent CBC shows WBC count of 11.3 and slightly elevated platelets at 463.  CMP normal.  Sed rate, TSH, and celiac labs were previously unremarkable.     Current Medications, Allergies, Past Medical History, Past Surgical History, Family History and Social History were reviewed in [facility] electronic medical record.   Physical Exam: BP 120/70 mmHg  Pulse 95  Ht 5' 8" (1.727 m)  Wt 191 lb 12.8 oz (87 kg)  BMI 29.17 kg/m2  SpO2 96% General: Well developed white male in no acute distress Head: Normocephalic and atraumatic Eyes:  Sclerae anicteric, conjunctiva pink  Ears: Normal auditory acuity Lungs: Clear throughout to auscultation Heart: Regular rate and rhythm Abdomen: Soft, non-distended.  Normal bowel sounds.  Non-tender. Rectal:  Will be done at the time of colonoscopy. Musculoskeletal:  Symmetrical with no gross deformities  Extremities: No edema  Neurological: Alert oriented x 4, grossly non-focal Psychological:  Alert and cooperative. Normal mood and affect  Assessment and Recommendations: -Diarrhea with rectal bleeding and lower abdominal pain:  Symptoms have been present for years.  CT scan and lab evaluation by Dr. Pyrtle previously negative.  Colonoscopy previously recommended which the patient did not follow through with.  Will schedule for colonoscopy with Dr. Pyrtle to rule out IBD, microscopic colitis, etc.  He is on colchicine, which could definitely worsen his symptoms, however, he has only been on that again recently and GI symptoms were not improved/resolved while he was off of the medication.   CC:  Dr. Pickard  Addendum: Reviewed and agree with initial management. Jay M Pyrtle, MD    

## 2014-07-09 ENCOUNTER — Telehealth: Payer: Self-pay | Admitting: Internal Medicine

## 2014-07-09 ENCOUNTER — Encounter (HOSPITAL_COMMUNITY): Payer: Self-pay | Admitting: *Deleted

## 2014-07-09 ENCOUNTER — Other Ambulatory Visit: Payer: Self-pay

## 2014-07-09 ENCOUNTER — Telehealth: Payer: Self-pay | Admitting: Family Medicine

## 2014-07-09 DIAGNOSIS — R197 Diarrhea, unspecified: Secondary | ICD-10-CM

## 2014-07-09 NOTE — Telephone Encounter (Signed)
PATIENT DOES WANT TO CANCEL THE APT - PLEASE DISREGARD THIS PHONE NOTE.  Pt would like a call back to make sure the apt is still scheduled.

## 2014-07-09 NOTE — Telephone Encounter (Signed)
Patient is calling to ask some questions? Would not be specific  [phone]

## 2014-07-09 NOTE — Telephone Encounter (Signed)
Pt was asking questions in regards to his insurance not covering his colonoscopy - informed pt that it was up to the GI office to get his insurance to cover it and I apologized and informed him to call the GI.

## 2014-07-09 NOTE — Telephone Encounter (Signed)
[facility]

## 2014-07-10 ENCOUNTER — Encounter (HOSPITAL_COMMUNITY)
Admission: RE | Disposition: A | Discharge status: Home / Self Care | Payer: Self-pay | Source: Ambulatory Visit | Attending: Internal Medicine

## 2014-07-10 ENCOUNTER — Encounter (HOSPITAL_COMMUNITY): Payer: Self-pay | Admitting: Gastroenterology

## 2014-07-10 ENCOUNTER — Ambulatory Visit (HOSPITAL_COMMUNITY)
Admission: RE | Admit: 2014-07-10 | Discharge: 2014-07-10 | Disposition: A | Discharge status: Home / Self Care | Payer: BLUE CROSS/BLUE SHIELD | Source: Ambulatory Visit | Attending: Internal Medicine | Admitting: Internal Medicine

## 2014-07-10 ENCOUNTER — Ambulatory Visit (HOSPITAL_COMMUNITY): Payer: BLUE CROSS/BLUE SHIELD | Admitting: Anesthesiology

## 2014-07-10 DIAGNOSIS — K625 Hemorrhage of anus and rectum: Secondary | ICD-10-CM | POA: Diagnosis present

## 2014-07-10 DIAGNOSIS — R197 Diarrhea, unspecified: Secondary | ICD-10-CM

## 2014-07-10 DIAGNOSIS — K529 Noninfective gastroenteritis and colitis, unspecified: Secondary | ICD-10-CM | POA: Diagnosis not present

## 2014-07-10 DIAGNOSIS — F17200 Nicotine dependence, unspecified, uncomplicated: Secondary | ICD-10-CM | POA: Insufficient documentation

## 2014-07-10 DIAGNOSIS — K51911 Ulcerative colitis, unspecified with rectal bleeding: Secondary | ICD-10-CM

## 2014-07-10 DIAGNOSIS — K5190 Ulcerative colitis, unspecified, without complications: Secondary | ICD-10-CM | POA: Insufficient documentation

## 2014-07-10 HISTORY — DX: Family history of other specified conditions: Z84.89

## 2014-07-10 HISTORY — PX: COLONOSCOPY: SHX5424

## 2014-07-10 SURGERY — COLONOSCOPY
Anesthesia: Monitor Anesthesia Care

## 2014-07-10 SURGERY — COLONOSCOPY WITH PROPOFOL
Anesthesia: Monitor Anesthesia Care

## 2014-07-10 MED ORDER — LIDOCAINE HCL (CARDIAC) 20 MG/ML IV SOLN
INTRAVENOUS | Status: AC
  Filled 2014-07-10: qty 5

## 2014-07-10 MED ORDER — SODIUM CHLORIDE 0.9 % IV SOLN: INTRAVENOUS | Status: DC

## 2014-07-10 MED ORDER — PROPOFOL INFUSION 10 MG/ML OPTIME
INTRAVENOUS | Status: DC | PRN
  Administered 2014-07-10: 140 ug/kg/min via INTRAVENOUS

## 2014-07-10 MED ORDER — LACTATED RINGERS IV SOLN
INTRAVENOUS | Status: DC
  Administered 2014-07-10: 1000 mL via INTRAVENOUS

## 2014-07-10 MED ORDER — MESALAMINE 1.2 G PO TBEC: 4.8000 g | DELAYED_RELEASE_TABLET | Freq: Every day | ORAL | Status: DC

## 2014-07-10 MED ORDER — PROPOFOL 10 MG/ML IV BOLUS
INTRAVENOUS | Status: AC
  Filled 2014-07-10: qty 20

## 2014-07-10 MED ORDER — PROPOFOL 10 MG/ML IV BOLUS
INTRAVENOUS | Status: DC | PRN
  Administered 2014-07-10: 30 mg via INTRAVENOUS

## 2014-07-10 MED ORDER — LIDOCAINE HCL (PF) 2 % IJ SOLN
INTRAMUSCULAR | Status: DC | PRN
  Administered 2014-07-10: 100 mg via INTRADERMAL

## 2014-07-10 NOTE — H&P (View-Only) (Signed)
     07/05/2014 Robert Francis [id number] 30-Sep-1980   History of Present Illness:  This is a 34 year old male who was previously seen by Dr. Rhea BeltonPyrtle for evaluation of diarrhea, rectal bleeding, and abdominal pain in 11/2011.  Evaluation included CT scan of the abdomen and pelvis with contrast, which was unremarkable.    It was recommended that if symptoms did not improve and other lab evaluation was negative then next step would be colonoscopy.  He is here now to discuss scheduling that procedure.  He says that it is extremely hard to make medical appointments, etc due to his job, which is why it has taken him so long to come back to the office.  He reports continued diarrhea several times a day; says that it looks like muddy water and has not been solid in several years.  He does see some blood on occasion.  Has a lot of abdominal pain/cramping and gas.  He is taking colchicine and had been on this at some point in the past as well (says that it definitely worsens the diarrhea), but even all of the time that he was not taking it the diarrhea never went away and really did not even get much better.  Recent CBC shows WBC count of 11.3 and slightly elevated platelets at 463.  CMP normal.  Sed rate, TSH, and celiac labs were previously unremarkable.     Current Medications, Allergies, Past Medical History, Past Surgical History, Family History and Social History were reviewed in [facility] electronic medical record.   Physical Exam: BP 120/70 mmHg  Pulse 95  Ht 5\' 8"  (1.727 m)  Wt 191 lb 12.8 oz (87 kg)  BMI 29.17 kg/m2  SpO2 96% General: Well developed white male in no acute distress Head: Normocephalic and atraumatic Eyes:  Sclerae anicteric, conjunctiva pink  Ears: Normal auditory acuity Lungs: Clear throughout to auscultation Heart: Regular rate and rhythm Abdomen: Soft, non-distended.  Normal bowel sounds.  Non-tender. Rectal:  Will be done at the time of colonoscopy. Musculoskeletal:  Symmetrical with no gross deformities  Extremities: No edema  Neurological: Alert oriented x 4, grossly non-focal Psychological:  Alert and cooperative. Normal mood and affect  Assessment and Recommendations: -Diarrhea with rectal bleeding and lower abdominal pain:  Symptoms have been present for years.  CT scan and lab evaluation by Dr. Rhea BeltonPyrtle previously negative.  Colonoscopy previously recommended which the patient did not follow through with.  Will schedule for colonoscopy with Dr. Rhea BeltonPyrtle to rule out IBD, microscopic colitis, etc.  He is on colchicine, which could definitely worsen his symptoms, however, he has only been on that again recently and GI symptoms were not improved/resolved while he was off of the medication.   CC:  Dr. Tanya NonesPickard  Addendum: Reviewed and agree with initial management. Beverley FiedlerJay M Pyrtle, MD

## 2014-07-10 NOTE — Interval H&P Note (Signed)
History and Physical Interval Note: 34 year old male with history of rectal bleeding, lower abdominal pain and diarrhea who presents for outpatient colonoscopy. Was seen in the office last week by Robert SouJessica Zehr, Robert Francis. The nature of the procedure, as well as the risks, benefits, and alternatives were carefully and thoroughly reviewed with the patient. Ample time for discussion and questions allowed. The patient understood, was satisfied, and agreed to proceed.    07/10/2014 10:23 AM  Robert Francis  has presented today for surgery, with the diagnosis of Diarrhea  The various methods of treatment have been discussed with the patient and family. After consideration of risks, benefits and other options for treatment, the patient has consented to  Procedure(s): COLONOSCOPY (N/A) as a surgical intervention .  The patient's history has been reviewed, patient examined, no change in status, stable for surgery.  I have reviewed the patient's chart and labs.  Questions were answered to the patient's satisfaction.     Robert Francis

## 2014-07-10 NOTE — Discharge Instructions (Signed)

## 2014-07-10 NOTE — Anesthesia Preprocedure Evaluation (Signed)
Anesthesia Evaluation  Patient identified by MRN, date of birth, ID band Patient awake    Reviewed: Allergy & Precautions, NPO status , Patient's Chart, lab work & pertinent test results  Airway Mallampati: II  TM Distance: >3 FB Neck ROM: Full    Dental no notable dental hx.    Pulmonary Current Smoker,  breath sounds clear to auscultation  Pulmonary exam normal       Cardiovascular negative cardio ROS  Rhythm:Regular Rate:Normal     Neuro/Psych negative neurological ROS  negative psych ROS   GI/Hepatic negative GI ROS, Neg liver ROS,   Endo/Other  negative endocrine ROS  Renal/GU negative Renal ROS  negative genitourinary   Musculoskeletal negative musculoskeletal ROS (+)   Abdominal   Peds negative pediatric ROS (+)  Hematology negative hematology ROS (+)   Anesthesia Other Findings   Reproductive/Obstetrics negative OB ROS                             Anesthesia Physical Anesthesia Plan  ASA: II  Anesthesia Plan: MAC   Post-op Pain Management:    Induction:   Airway Management Planned: Simple Face Mask  Additional Equipment:   Intra-op Plan:   Post-operative Plan:   Informed Consent: I have reviewed the patients History and Physical, chart, labs and discussed the procedure including the risks, benefits and alternatives for the proposed anesthesia with the patient or authorized representative who has indicated his/her understanding and acceptance.   Dental advisory given  Plan Discussed with: CRNA  Anesthesia Plan Comments:         Anesthesia Quick Evaluation

## 2014-07-10 NOTE — Transfer of Care (Signed)
Immediate Anesthesia Transfer of Care Note  Patient: Robert Francis  Procedure(s) Performed: Procedure(s): COLONOSCOPY (N/A)  Patient Location: PACU  Anesthesia Type:MAC  Level of Consciousness: sedated  Airway & Oxygen Therapy: Patient Spontanous Breathing and Patient connected to nasal cannula oxygen  Post-op Assessment: Report given to RN and Post -op Vital signs reviewed and stable  Post vital signs: Reviewed and stable  Last Vitals:  Filed Vitals:   07/10/14 0944  BP: 122/83  Pulse: 55  Temp: 36.6 C  Resp: 14    Complications: No apparent anesthesia complications

## 2014-07-10 NOTE — Op Note (Signed)
[facility] [address] [place], [id number]   COLONOSCOPY PROCEDURE REPORT  PATIENT: Robert Francis, [name]  MR#: [id number] BIRTHDATE: 1980/11/29 , 33  yrs. old GENDER: male ENDOSCOPIST: Beverley FiedlerJay M Quintasia Theroux, MD PROCEDURE DATE:  07/10/2014 PROCEDURE:   Colonoscopy with biopsy First Screening Colonoscopy - Avg.  risk and is 50 yrs.  old or older - No.  Prior Negative Screening - Now for repeat screening. N/A  History of Adenoma - Now for follow-up colonoscopy & has been > or = to 3 yrs.  N/A  Polyps Removed Today? No.  Polyps Removed Today? No.  Recommend repeat exam, <10 yrs? Polyps Removed Today? No.  Recommend repeat exam, <10 yrs? No. ASA CLASS:   Class II INDICATIONS:abdominal pain, chronic diarrhea, and rectal bleeding.  MEDICATIONS: Monitored anesthesia care and Per Anesthesia  DESCRIPTION OF PROCEDURE:   After the risks benefits and alternatives of the procedure were thoroughly explained, informed consent was obtained.  The digital rectal exam revealed no rectal mass.   The Pentax Adult Colonoscope [id number]  endoscope was introduced through the anus and advanced to the terminal ileum which was intubated for a short distance. No adverse events experienced.   The quality of the prep was good.  (MoviPrep was used)  The instrument was then slowly withdrawn as the colon was fully examined.    COLON FINDINGS: The examined terminal ileum appeared to be normal. Pan-colitis in continuous fashion characterized by granularity, erythema, loss of normal vascular pattern and edema was present throughout the entire examined colon.  Overall the colitis was mild.  Multiple biopsies were performed using cold forceps from the right and left colon..  Retroflexed views revealed no abnormalities. The time to cecum = 4 min Withdrawal time = 8 min The scope was withdrawn and the procedure completed. COMPLICATIONS: There were no immediate complications.  ENDOSCOPIC IMPRESSION: 1.   The  examined terminal ileum appeared to be normal 2.   Mild pan-colitis most consistent with ulcerative colitis; multiple biopsies were performed using cold forceps  RECOMMENDATIONS: 1.  Await biopsy results 2.  Begin Lialda 4.8 g daily 3.  Avoid NSAIDs 4.  Office followup in 1-2 months  eSigned:  Beverley FiedlerJay M Terrall Bley, MD 07/10/2014 11:13 AM   cc: the patient, PCP

## 2014-07-10 NOTE — Anesthesia Postprocedure Evaluation (Signed)
  Anesthesia Post-op Note  Patient: Robert Francis  Procedure(s) Performed: Procedure(s) (LRB): COLONOSCOPY (N/A)  Patient Location: PACU  Anesthesia Type: MAC  Level of Consciousness: awake and alert   Airway and Oxygen Therapy: Patient Spontanous Breathing  Post-op Pain: mild  Post-op Assessment: Post-op Vital signs reviewed, Patient's Cardiovascular Status Stable, Respiratory Function Stable, Patent Airway and No signs of Nausea or vomiting  Last Vitals:  Filed Vitals:   07/10/14 1150  BP: 125/86  Pulse: 57  Temp:   Resp: 17    Post-op Vital Signs: stable   Complications: No apparent anesthesia complications

## 2014-07-11 ENCOUNTER — Encounter (HOSPITAL_COMMUNITY): Payer: Self-pay | Admitting: Internal Medicine

## 2014-08-17 ENCOUNTER — Encounter: Payer: Self-pay | Admitting: *Deleted

## 2014-09-12 ENCOUNTER — Ambulatory Visit: Payer: BLUE CROSS/BLUE SHIELD | Admitting: Internal Medicine

## 2014-10-06 ENCOUNTER — Other Ambulatory Visit: Payer: Self-pay | Admitting: Family Medicine

## 2014-11-17 ENCOUNTER — Other Ambulatory Visit: Payer: Self-pay | Admitting: Internal Medicine

## 2015-02-22 ENCOUNTER — Other Ambulatory Visit: Payer: Self-pay | Admitting: Family Medicine

## 2015-02-22 MED ORDER — COLCHICINE 0.6 MG PO TABS: 0.6000 mg | ORAL_TABLET | Freq: Every day | ORAL | Status: DC

## 2015-02-22 MED ORDER — ALLOPURINOL 100 MG PO TABS: 200.0000 mg | ORAL_TABLET | Freq: Every day | ORAL | Status: DC

## 2015-02-22 MED ORDER — MESALAMINE 1.2 G PO TBEC: DELAYED_RELEASE_TABLET | ORAL | Status: DC

## 2015-02-22 NOTE — Telephone Encounter (Signed)
Pt needs a refill of Lialda, Allopurinol and Colchicine. He uses the [facility] in [place]

## 2015-02-22 NOTE — Telephone Encounter (Signed)
Medication refilled per protocol. 

## 2015-02-28 ENCOUNTER — Encounter: Payer: Self-pay | Admitting: Physician Assistant

## 2015-02-28 ENCOUNTER — Ambulatory Visit (INDEPENDENT_AMBULATORY_CARE_PROVIDER_SITE_OTHER): Payer: BLUE CROSS/BLUE SHIELD | Admitting: Physician Assistant

## 2015-02-28 VITALS — BP 110/66 | HR 60 | Temp 98.0°F | Resp 18 | Wt 189.0 lb

## 2015-02-28 DIAGNOSIS — R3915 Urgency of urination: Secondary | ICD-10-CM

## 2015-02-28 LAB — URINALYSIS, ROUTINE W REFLEX MICROSCOPIC
BILIRUBIN URINE: NEGATIVE
Glucose, UA: NEGATIVE
Hgb urine dipstick: NEGATIVE
Ketones, ur: NEGATIVE
Leukocytes, UA: NEGATIVE
NITRITE: NEGATIVE
PROTEIN: NEGATIVE
Specific Gravity, Urine: 1.025 (ref 1.001–1.035)
pH: 6.5 (ref 5.0–8.0)

## 2015-02-28 MED ORDER — MESALAMINE 1.2 G PO TBEC: DELAYED_RELEASE_TABLET | ORAL | Status: DC

## 2015-02-28 MED ORDER — ALLOPURINOL 100 MG PO TABS: 200.0000 mg | ORAL_TABLET | Freq: Every day | ORAL | Status: DC

## 2015-02-28 MED ORDER — COLCHICINE 0.6 MG PO TABS: 0.6000 mg | ORAL_TABLET | Freq: Every day | ORAL | Status: DC

## 2015-02-28 NOTE — Progress Notes (Signed)
Patient ID: Robert StalkerBert Francis MRN: [id number], DOB: 17-May-1980, 34 y.o. Date of Encounter: 02/28/2015, 3:07 PM    Chief Complaint:  Chief Complaint  Patient presents with  . c/o UTI    urgency, frequency, foul oder     HPI: 34 y.o. year old white male reports that he has noticed his urine having a very bad odor recently. Also seems that he will urinate a small amount but then felt like he did not get all of the urine out. Has had no dysuria. No penile discharge. No fevers or chills. No history of UTI. Once I got results that you urinalysis normal, discussed evaluating for STD. He states that there is no way that he can have an STD so I just drop that subject.     Home Meds:   Outpatient Prescriptions Prior to Visit  Medication Sig Dispense Refill  . ibuprofen (ADVIL,MOTRIN) 200 MG tablet Take 400-600 mg by mouth every 6 (six) hours as needed for headache or moderate pain.    Marland Kitchen. allopurinol (ZYLOPRIM) 100 MG tablet Take 2 tablets (200 mg total) by mouth daily. (Patient not taking: Reported on 02/28/2015) 60 tablet 0  . colchicine 0.6 MG tablet Take 1 tablet (0.6 mg total) by mouth daily. (Patient not taking: Reported on 02/28/2015) 30 tablet 0  . mesalamine (LIALDA) 1.2 G EC tablet TAKE 4 TABLETS BY MOUTH DAILY WITH BREAKFAST (Patient not taking: Reported on 02/28/2015) 120 tablet 0   No facility-administered medications prior to visit.    Allergies: No Known Allergies    Review of Systems: See HPI for pertinent ROS. All other ROS negative.    Physical Exam: Blood pressure 110/66, pulse 60, temperature 98 F (36.7 C), temperature source Oral, resp. rate 18, weight 189 lb (85.73 kg)., Body mass index is 28.74 kg/(m^2). General:  WNWD WM. Appears in no acute distress. Neck: Supple. No thyromegaly. No lymphadenopathy. Lungs: Clear bilaterally to auscultation without wheezes, rales, or rhonchi. Breathing is unlabored. Heart: Regular rhythm. No murmurs, rubs, or gallops. Abdomen:  Soft, non-tender, non-distended with normoactive bowel sounds. No hepatomegaly. No rebound/guarding. No obvious abdominal masses. Msk:  Strength and tone normal for age. No costophrenic angle tenderness with percussion bilaterally. Extremities/Skin: Warm and dry. Neuro: Alert and oriented X 3. Moves all extremities spontaneously. Gait is normal. CNII-XII grossly in tact. Psych:  Responds to questions appropriately with a normal affect.   Results for orders placed or performed in visit on 02/28/15  Urinalysis, Routine w reflex microscopic (not at [facility])  Result Value Ref Range   Color, Urine YELLOW YELLOW   APPearance CLOUDY (A) CLEAR   Specific Gravity, Urine 1.025 1.001 - 1.035   pH 6.5 5.0 - 8.0   Glucose, UA NEGATIVE NEGATIVE   Bilirubin Urine NEGATIVE NEGATIVE   Ketones, ur NEGATIVE NEGATIVE   Hgb urine dipstick NEGATIVE NEGATIVE   Protein, ur NEGATIVE NEGATIVE   Nitrite NEGATIVE NEGATIVE   Leukocytes, UA NEGATIVE NEGATIVE     ASSESSMENT AND PLAN:  34 y.o. year old male with  1. Urgency of urination - Urinalysis, Routine w reflex microscopic (not at [facility]) Reassured him that urinalysis is normal and shows no signs of urinary infection.  Recommend drink lots of water throughout the day--- at least 8 glasses of 8 ounce water. Follow-up if this does not resolve. He also states that he has to work out of town and is very difficult for him to schedule office visits in advance.  Says this is why his  medications have run out and he is requesting refills to hold him over until he can follow-up with GI. Reviewed his chart and his lab work and sent refills on medications to hold him over.  Murray Hodgkins [place], [place], [facility] 02/28/2015 3:07 PM

## 2015-06-25 ENCOUNTER — Telehealth: Payer: Self-pay | Admitting: Family Medicine

## 2015-06-25 MED ORDER — HYDROCODONE-ACETAMINOPHEN 5-325 MG PO TABS: 1.0000 | ORAL_TABLET | Freq: Four times a day (QID) | ORAL | Status: DC | PRN

## 2015-06-25 MED ORDER — COLCHICINE 0.6 MG PO TABS: 0.6000 mg | ORAL_TABLET | Freq: Every day | ORAL | Status: DC

## 2015-06-25 NOTE — Telephone Encounter (Signed)
rx ready and pt aware 

## 2015-06-25 NOTE — Telephone Encounter (Signed)
Ok with colchicine, but I have no record of hydrocodone.

## 2015-06-25 NOTE — Telephone Encounter (Signed)
Patient would like to pick up rx for his hydrocodone and also have [facility]icine [facility] to [facility] [place] the number is [phone] patients number

## 2015-06-25 NOTE — Telephone Encounter (Signed)
Colchicine to pharmacy per protocol.  Pt asking for Hydrocodone??  Not on med list.  Taken off med list in October.  Please advise?

## 2015-06-25 NOTE — Telephone Encounter (Signed)
i am okay giving him norco 5/325 1-2 q 6 hrs prn 10

## 2015-06-25 NOTE — Telephone Encounter (Signed)
Having gout flare in big toe rt foot.  Uses very sparingly, that why removed from med list.  Hydrocodone 5/500 last Rx from 2015 for only # 12.  He says that how infrequent he uses.

## 2015-11-08 ENCOUNTER — Ambulatory Visit: Payer: BLUE CROSS/BLUE SHIELD

## 2016-04-15 ENCOUNTER — Other Ambulatory Visit: Payer: Self-pay | Admitting: Physician Assistant

## 2016-08-28 ENCOUNTER — Ambulatory Visit (INDEPENDENT_AMBULATORY_CARE_PROVIDER_SITE_OTHER): Payer: BLUE CROSS/BLUE SHIELD | Admitting: Family Medicine

## 2016-08-28 ENCOUNTER — Encounter: Payer: Self-pay | Admitting: Family Medicine

## 2016-08-28 VITALS — BP 110/76 | HR 76 | Temp 98.7°F | Resp 16 | Ht 68.0 in | Wt 190.0 lb

## 2016-08-28 DIAGNOSIS — L723 Sebaceous cyst: Secondary | ICD-10-CM | POA: Diagnosis not present

## 2016-08-28 NOTE — Progress Notes (Signed)
   Subjective:    Patient ID: Robert Francis, male    DOB: August 28, 1980, 36 y.o.   MRN: [id number]  HPI  He reports several bumps on his back. On exam, he has 4 subcutaneous masses on his back. On his lower left flank, there is a 2.5 cm approximately cystlike mass. On his right posterior shoulder there are 2 smaller half centimeter cystlike mass. In the center of his back there is a 1 cm cystlike mass. All these are consistent with sebaceous cyst although lipoma cannot be excluded. He is requesting excision of all. I explained that we cannot perform all the day. Therefore he asked that we remove the largest one on his left flank Past Medical History:  Diagnosis Date  . Colitis   . Family history of adverse reaction to anesthesia    sister slow to awaken  . Gout   . Smoker    Past Surgical History:  Procedure Laterality Date  . COLONOSCOPY N/A 07/10/2014   Procedure: COLONOSCOPY;  Surgeon: Beverley Fiedler, MD;  Location: [facility];  Service: Gastroenterology;  Laterality: N/A;  . WISDOM TOOTH EXTRACTION  2015   No current outpatient prescriptions on file prior to visit.   No current facility-administered medications on file prior to visit.    No Known Allergies Social History   Social History  . Marital status: Single    Spouse name: N/A  . Number of children: N/A  . Years of education: N/A   Occupational History  . Not on file.   Social History Main Topics  . Smoking status: Current Every Day Smoker    Packs/day: 1.00    Types: Cigarettes  . Smokeless tobacco: Never Used     Comment: uses cigarettes 3 packs/ month  . Alcohol use Yes     Comment: rarely  . Drug use: No  . 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 Not on file   Social History Narrative  . No narrative on file     Review of Systems  All other systems reviewed and are negative.      Objective:   Physical Exam    Constitutional: He appears well-developed and well-nourished.  Cardiovascular: Normal rate, regular rhythm and normal heart sounds.   Pulmonary/Chest: Effort normal and breath sounds normal. No respiratory distress. He has no wheezes. He has no rales.  Vitals reviewed.   See hpi      Assessment & Plan:  Sebaceous cyst  Area was anesthetized on his lower left flank was 0.1% lidocaine with epinephrine. The patient was then prepped and draped in sterile fashion, a 3 x 2 cm ellipse was made around the entire subcutaneous mass using a scalpel. A pair of forceps and gentle traction, the subcutaneous mass/cyst was then removed bluntly from the underlying tissue with a scalpel. The cyst was removed with the sac contents intact. The wound was then cleaned with Betadine again. There was no residual mass. The subcutaneous fascia was then approximated using 3 simple interrupted Vicryl 3-0 sutures.  The skin edges were then approximated with 5 simple interrupted 3-0 Ethilon sutures. The cyst was then thrown away and not sent to pathology as it was benign-appearing. The wound was then cleaned and dressed appropriately. Stitches out in 7 days. Recheck immediately if symptoms of infection develop. Other cysts can be removed in the future at his convenience. There was minimal blood loss

## 2016-09-08 ENCOUNTER — Ambulatory Visit: Payer: BLUE CROSS/BLUE SHIELD | Admitting: Family Medicine

## 2016-09-08 ENCOUNTER — Encounter: Payer: Self-pay | Admitting: Family Medicine

## 2016-09-08 ENCOUNTER — Ambulatory Visit (INDEPENDENT_AMBULATORY_CARE_PROVIDER_SITE_OTHER): Payer: BLUE CROSS/BLUE SHIELD | Admitting: Family Medicine

## 2016-09-08 VITALS — BP 100/78 | HR 76 | Temp 98.2°F | Resp 14 | Wt 189.0 lb

## 2016-09-08 DIAGNOSIS — L723 Sebaceous cyst: Secondary | ICD-10-CM

## 2016-09-08 NOTE — Progress Notes (Signed)
Subjective:    Patient ID: Robert Francis, male    DOB: 04-02-1981, 36 y.o.   MRN: [id number]  HPI 08/28/16 He reports several bumps on his back. On exam, he has 4 subcutaneous masses on his back. On his lower left flank, there is a 2.5 cm approximately cystlike mass. On his right posterior shoulder there are 2 smaller half centimeter cystlike mass. In the center of his back there is a 1 cm cystlike mass. All these are consistent with sebaceous cyst although lipoma cannot be excluded. He is requesting excision of all. I explained that we cannot perform all the day. Therefore he asked that we remove the largest one on his left flank.  At that time, my plan was: Area was anesthetized on his lower left flank was 0.1% lidocaine with epinephrine. The patient was then prepped and draped in sterile fashion, a 3 x 2 cm ellipse was made around the entire subcutaneous mass using a scalpel. A pair of forceps and gentle traction, the subcutaneous mass/cyst was then removed bluntly from the underlying tissue with a scalpel. The cyst was removed with the sac contents intact. The wound was then cleaned with Betadine again. There was no residual mass. The subcutaneous fascia was then approximated using 3 simple interrupted Vicryl 3-0 sutures.  The skin edges were then approximated with 5 simple interrupted 3-0 Ethilon sutures. The cyst was then thrown away and not sent to pathology as it was benign-appearing. The wound was then cleaned and dressed appropriately. Stitches out in 7 days. Recheck immediately if symptoms of infection develop. Other cysts can be removed in the future at his convenience. There was minimal blood loss  09/08/16 Patient is here to have the stitches removed. 5 sutures removed without difficulty. Wound appears well-healing. There is no evidence of cellulitis. He would now like the next largest cyst removed from the center of his back. This is approximately 2 cm in diameter. It is located approximately  the level of T12 in the center of his back. Past Medical History:  Diagnosis Date  . Colitis   . Family history of adverse reaction to anesthesia    sister slow to awaken  . Gout   . Smoker    Past Surgical History:  Procedure Laterality Date  . COLONOSCOPY N/A 07/10/2014   Procedure: COLONOSCOPY;  Surgeon: Beverley FiedlerJay M Pyrtle, MD;  Location: [facility];  Service: Gastroenterology;  Laterality: N/A;  . WISDOM TOOTH EXTRACTION  2015   Current Outpatient Prescriptions on File Prior to Visit  Medication Sig Dispense Refill  . colchicine 0.6 MG tablet Take 0.6 mg by mouth daily.    Marland Kitchen. HYDROcodone-acetaminophen (NORCO/VICODIN) 5-325 MG tablet Take 1 tablet by mouth every 6 (six) hours as needed for moderate pain. Prn Gout flare     No current facility-administered medications on file prior to visit.    No Known Allergies Social History   Social History  . Marital status: Single    Spouse name: N/A  . Number of children: N/A  . Years of education: N/A   Occupational History  . Not on file.   Social History Main Topics  . Smoking status: Current Every Day Smoker    Packs/day: 1.00    Types: Cigarettes  . Smokeless tobacco: Never Used     Comment: uses cigarettes 3 packs/ month  . Alcohol use Yes     Comment: rarely  . Drug use: No  . 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 Not on file   Social History Narrative  . No narrative on file     Review of Systems  All other systems reviewed and are negative.      Objective:   Physical Exam  Constitutional: He appears well-developed and well-nourished.  Cardiovascular: Normal rate, regular rhythm and normal heart sounds.   Pulmonary/Chest: Effort normal and breath sounds normal. No respiratory distress. He has no wheezes. He has no rales.  Vitals reviewed.   See hpi      Assessment & Plan:  Sebaceous cyst Area was anesthetized in  the center of his back with  0.1% lidocaine with epinephrine. The patient was then prepped and draped in sterile fashion.  A 2 x 2 cm ellipse was made around the entire cyst using a scalpel. With a pair of forceps and gentle traction, the subcutaneous mass/cyst was then removed bluntly from the underlying tissue with a scalpel. The cyst was removed with the sac contents intact. The wound was then cleaned with Betadine again. There was no residual mass. The subcutaneous fascia was then approximated using 2 simple interrupted Vicryl 3-0 sutures.  The skin edges were then approximated in a vertical line with 4 simple interrupted 3-0 Ethilon sutures. The cyst was then thrown away and not sent to pathology as it was benign-appearing. The wound was then cleaned and dressed appropriately. Stitches out in 10 days. Recheck immediately if symptoms of infection develop. Other cysts can be removed in the future at his convenience. There was minimal blood loss

## 2016-09-09 ENCOUNTER — Other Ambulatory Visit: Payer: Self-pay | Admitting: Family Medicine

## 2016-09-09 MED ORDER — COLCHICINE 0.6 MG PO TABS: 0.6000 mg | ORAL_TABLET | Freq: Every day | ORAL | 3 refills | Status: DC

## 2016-09-14 ENCOUNTER — Other Ambulatory Visit: Payer: Self-pay | Admitting: Family Medicine

## 2016-09-14 MED ORDER — COLCHICINE 0.6 MG PO TABS: 0.6000 mg | ORAL_TABLET | Freq: Every day | ORAL | 3 refills | Status: DC

## 2017-01-06 ENCOUNTER — Other Ambulatory Visit: Payer: Self-pay | Admitting: *Deleted

## 2017-01-06 MED ORDER — COLCHICINE 0.6 MG PO TABS: 0.6000 mg | ORAL_TABLET | Freq: Every day | ORAL | 3 refills | Status: AC

## 2017-01-06 NOTE — Telephone Encounter (Signed)
Received call from patient.   Reports that he is out of town working and requires refill on Colcrys. Prescription sent to pharmacy per patient request.

## 2017-03-24 ENCOUNTER — Encounter: Payer: Self-pay | Admitting: Family Medicine

## 2017-03-24 ENCOUNTER — Ambulatory Visit: Payer: BLUE CROSS/BLUE SHIELD | Admitting: Family Medicine

## 2017-03-24 ENCOUNTER — Other Ambulatory Visit: Payer: Self-pay

## 2017-03-24 VITALS — BP 108/78 | HR 68 | Temp 98.2°F | Resp 14 | Ht 68.0 in | Wt 189.0 lb

## 2017-03-24 DIAGNOSIS — S161XXA Strain of muscle, fascia and tendon at neck level, initial encounter: Secondary | ICD-10-CM

## 2017-03-24 DIAGNOSIS — M1000 Idiopathic gout, unspecified site: Secondary | ICD-10-CM | POA: Diagnosis not present

## 2017-03-24 MED ORDER — CYCLOBENZAPRINE HCL 5 MG PO TABS
5.0000 mg | ORAL_TABLET | Freq: Three times a day (TID) | ORAL | 1 refills | Status: DC | PRN
Start: 2017-03-24 — End: 2020-07-31

## 2017-03-24 MED ORDER — HYDROCODONE-ACETAMINOPHEN 5-325 MG PO TABS: 1.0000 | ORAL_TABLET | Freq: Four times a day (QID) | ORAL | 0 refills | Status: DC | PRN

## 2017-03-24 MED ORDER — METHYLPREDNISOLONE 4 MG PO TBPK: ORAL_TABLET | ORAL | 0 refills | Status: DC

## 2017-03-24 NOTE — Patient Instructions (Signed)
F/U as needed

## 2017-03-24 NOTE — Progress Notes (Signed)
   Subjective:    Patient ID: Robert Francis, male    DOB: [birth year], 36 y.o.   MRN: [id number]  Patient presents for L Side Neck Pain (x10 days- pain begins above ear on L side and radiates down to shoulder)  Patient here with neck pain on the left side radiating into his shoulder and down his back for the past week and a half.  No previous history of any neck pain or injury.  He has tried some icy hot.  Because of his colitis he cannot take NSAIDs.  He denies any tingling or numbness in his fingertips.  He is unclear what started off the pain.  But it has been progressive.  First states that he cannot move his neck around without pain but that has improved some.  He also has history of gout currently has a gout flare in his toe.  He is currently on colchicine which is helped some.   Review Of Systems:  GEN- denies fatigue, fever, weight loss,weakness, recent illness HEENT- denies eye drainage, change in vision, nasal discharge, CVS- denies chest pain, palpitations RESP- denies SOB, cough, wheeze ABD- denies N/V, change in stools, abd pain GU- denies dysuria, hematuria, dribbling, incontinence MSK- + joint pain, muscle aches, injury Neuro- denies headache, dizziness, syncope, seizure activity       Objective:    BP 108/78   Pulse 68   Temp 98.2 F (36.8 C) (Oral)   Resp 14   Ht 5\' 8"  (1.727 m)   Wt 189 lb (85.7 kg)   SpO2 96%   BMI 28.74 kg/m  GEN- NAD, alert and oriented x3 HEENT- PERRL, EOMI, non injected sclera, pink conjunctiva, MMM, oropharynx clear Neck- Supple, no thyromegaly, fair ROM, TTP along left trapezius and cervical paraspinals, +spasm, MSK- Rotator cuff in tact, FROM upper ext  CVS- RRR, no murmur RESP-CTAB         Assessment & Plan:      Problem List Items Addressed This Visit      Unprioritized   Gout    Other Visit Diagnoses    Strain of neck muscle, initial encounter    -  Primary   MSK pain, no red flags no neuropathy signs, due to  colitis, given medrol dosepak, flexeril, norco. Steroid and norco also for his gout . can use heat, massage      Note: This dictation was prepared with Dragon dictation along with smaller phrase technology. Any transcriptional errors that result from this process are unintentional.

## 2017-05-11 ENCOUNTER — Encounter: Payer: Self-pay | Admitting: Family Medicine

## 2017-05-11 ENCOUNTER — Ambulatory Visit: Payer: BLUE CROSS/BLUE SHIELD | Admitting: Family Medicine

## 2017-05-11 VITALS — BP 128/84 | HR 98 | Temp 97.8°F | Resp 16 | Ht 68.0 in | Wt 188.0 lb

## 2017-05-11 DIAGNOSIS — L723 Sebaceous cyst: Secondary | ICD-10-CM

## 2017-05-11 NOTE — Progress Notes (Signed)
Subjective:    Patient ID: Robert StalkerBert Francis, male    DOB: November 15, 1980, 37 y.o.   MRN: [id number]  HPI  Patient is here today requesting excision of 2 sebaceous cyst from his upper back on the posterior aspect of his right shoulder. The largest is approximately 1.2 cm in size. The smallest is approximately 8 mm in size. Both are soft and fluctuant well demarcated subcutaneous lesions with characteristic appearance and feel of a sebaceous cyst. Past Medical History:  Diagnosis Date  . Colitis   . Family history of adverse reaction to anesthesia    sister slow to awaken  . Gout   . Smoker    Past Surgical History:  Procedure Laterality Date  . COLONOSCOPY N/A 07/10/2014   Procedure: COLONOSCOPY;  Surgeon: Beverley FiedlerJay M Pyrtle, MD;  Location: [facility];  Service: Gastroenterology;  Laterality: N/A;  . WISDOM TOOTH EXTRACTION  2015   Current Outpatient Medications on File Prior to Visit  Medication Sig Dispense Refill  . colchicine 0.6 MG tablet Take 1 tablet (0.6 mg total) by mouth daily. (Patient taking differently: Take 0.6 mg by mouth daily. PRN) 30 tablet 3  . cyclobenzaprine (FLEXERIL) 5 MG tablet Take 1 tablet (5 mg total) by mouth 3 (three) times daily as needed for muscle spasms. 20 tablet 1  . HYDROcodone-acetaminophen (NORCO/VICODIN) 5-325 MG tablet Take 1 tablet by mouth every 6 (six) hours as needed for moderate pain. Prn Gout flare 45 tablet 0   No current facility-administered medications on file prior to visit.    Allergies  Allergen Reactions  . Nsaids Nausea Only    Due to crohn's   Social History   Socioeconomic History  . Marital status: Single    Spouse name: Not on file  . Number of children: Not on file  . Years of education: Not on file  . Highest education level: Not on file  Social Needs  . Financial resource strain: Not on file  . Food insecurity - worry: Not on file  . Food insecurity - inability: Not on file  . Transportation needs - medical: Not on file    . Transportation needs - non-medical: Not on file  Occupational History  . Not on file  Tobacco Use  . Smoking status: Current Every Day Smoker    Packs/day: 1.00    Types: Cigarettes  . Smokeless tobacco: Never Used  . Tobacco comment: uses cigarettes 3 packs/ month  Substance and Sexual Activity  . Alcohol use: Yes    Comment: rarely  . Drug use: No  . Sexual activity: Yes    Comment: works in Engineer, [employer] construction, married, wife expecting  Other Topics Concern  . Not on file  Social History Narrative  . Not on file     Review of Systems  All other systems reviewed and are negative.      Objective:   Physical Exam  Cardiovascular: Normal rate, regular rhythm and normal heart sounds.  Pulmonary/Chest: Effort normal and breath sounds normal.  Musculoskeletal:       Back:  Vitals reviewed.         Assessment & Plan:  Sebaceous cyst  Patient was anesthetized with 0.1% lidocaine with epinephrine in both locations. He was then prepped and draped in sterile fashion. Both cysts could be visualized through the single fenestrated window. First the inferior cyst was excised using a 1.5 x 2 cm elliptical excision down to the subcutaneous fascia. The entire cyst sac was removed with  the wall intact. The wound was then closed with 4 simple interrupted Ethilon sutures. Next the superior cyst was excised using a 1 cm x 2 cm elliptical excision down to the subcutaneous fascia. The cyst was removed in its entirety. The skin edges were then approximated using 3 simple interrupted 4-0 Ethilon sutures. Wound care was discussed. Stitches out in 7-10 days.

## 2017-08-09 ENCOUNTER — Encounter: Payer: Self-pay | Admitting: Family Medicine

## 2017-11-22 ENCOUNTER — Encounter: Payer: Self-pay | Admitting: Family Medicine

## 2020-07-31 ENCOUNTER — Other Ambulatory Visit: Payer: Self-pay

## 2020-07-31 ENCOUNTER — Encounter: Payer: Self-pay | Admitting: Cardiology

## 2020-07-31 ENCOUNTER — Ambulatory Visit: Payer: BLUE CROSS/BLUE SHIELD | Admitting: Cardiology

## 2020-07-31 VITALS — BP 132/86 | HR 67 | Temp 98.0°F | Ht 68.0 in | Wt 210.0 lb

## 2020-07-31 DIAGNOSIS — Z87891 Personal history of nicotine dependence: Secondary | ICD-10-CM

## 2020-07-31 DIAGNOSIS — R072 Precordial pain: Secondary | ICD-10-CM

## 2020-07-31 NOTE — Progress Notes (Signed)
Patient referred by Donita Brooks, MD for chest pain  Subjective:   Robert Francis, male    DOB: 1980-09-17, 40 y.o.   MRN: [id number]   Chief Complaint  Patient presents with  . Chest Pain  . Coronary Artery Disease     HPI  40 y.o. Caucasian male with gout, hyperlipidemia, prediabetes, former smoker, now with chest pain  Patient reports two episodes of crushing retrosternal chest pain while driving. Episodes lasted for 5-10 min, and were self limiting. Patient works as a Forensic psychologist for [employer]. His job is very stressful, working in Actor major structural damages like sinkhole. He has episodes of very high blood pressure during stressful moments (he carries a blood pressure monitor with him). At other times, his blood pressure is normal. He is former smoker, smoked 1-3 PPD for up to 20 years   Past Medical History:  Diagnosis Date  . Colitis   . Family history of adverse reaction to anesthesia    sister slow to awaken  . Gout   . Smoker      Past Surgical History:  Procedure Laterality Date  . COLONOSCOPY N/A 07/10/2014   Procedure: COLONOSCOPY;  Surgeon: Beverley Fiedler, MD;  Location: [facility];  Service: Gastroenterology;  Laterality: N/A;  . WISDOM TOOTH EXTRACTION  2015     Social History   Tobacco Use  Smoking Status Current Every Day Smoker  . Packs/day: 1.00  . Types: Cigarettes  Smokeless Tobacco Never Used  Tobacco Comment   uses cigarettes 3 packs/ month    Social History   Substance and Sexual Activity  Alcohol Use Yes   Comment: rarely     Family History  Problem Relation Age of Onset  . Colon cancer Neg Hx   . Hypertension Mother   . Heart disease Maternal Uncle        40's  . Cancer Maternal Grandmother        breast  . Heart disease Maternal Grandfather        67's  . Cancer Paternal Grandfather        brain tumor     Current Outpatient Medications on File Prior to Visit  Medication Sig Dispense  Refill  . colchicine 0.6 MG tablet Take 1 tablet (0.6 mg total) by mouth daily. (Patient taking differently: Take 0.6 mg by mouth daily. PRN) 30 tablet 3  . colchicine 0.6 MG tablet Take 1 tablet by mouth daily.    . predniSONE (DELTASONE) 10 MG tablet Take 10 mg by mouth daily.    . Probiotic Product (PRO-BIOTIC BLEND PO) Take by mouth.    . rosuvastatin (CRESTOR) 10 MG tablet Take 10 mg by mouth daily.    . sildenafil (VIAGRA) 100 MG tablet Take 100 mg by mouth daily as needed for erectile dysfunction.    . tadalafil (CIALIS) 20 MG tablet Take 1 tablet by mouth every 3 (three) days.    Marland Kitchen testosterone cypionate (DEPOTESTOSTERONE CYPIONATE) 200 MG/ML injection Inject 200 mg into the muscle every 28 (twenty-eight) days.     No current facility-administered medications on file prior to visit.    Cardiovascular and other pertinent studies:  EKG 07/31/2020: Sinus rhythm 63 bpm Nonspecific T-abnormality   Recent labs: Not available   Review of Systems  Cardiovascular: Positive for chest pain. Negative for dyspnea on exertion, leg swelling, palpitations and syncope.        Vitals:   07/31/20 0916  BP: 132/86  Pulse: 67  Temp: 98 F (36.7 C)  SpO2: 95%     Body mass index is 31.93 kg/m. Filed Weights   07/31/20 0916  Weight: 210 lb (95.3 kg)     Objective:   Physical Exam Vitals and nursing note reviewed.  Constitutional:      General: He is not in acute distress. Neck:     Vascular: No JVD.  Cardiovascular:     Rate and Rhythm: Normal rate and regular rhythm.     Heart sounds: Normal heart sounds. No murmur heard.   Pulmonary:     Effort: Pulmonary effort is normal.     Breath sounds: Normal breath sounds. No wheezing or rales.         Assessment & Recommendations:   40 y.o. Caucasian male with gout, hyperlipidemia, prediabetes, former smoker, now with chest pain  Chest pain: Atypical chest pain, but has risk factors for CAD with intermittent  hypertension, prior tobacco use.  Recommend exercise nuclear stress test , CT cardiac scoring. Will get labs from PCP  Further recommendations after above testing.   Thank you for referring the patient to [place]. Please feel free to contact with any questions.   Elder Negus, MD Pager: [phone] Office: [phone]

## 2021-03-03 ENCOUNTER — Other Ambulatory Visit: Payer: BC Managed Care – PPO

## 2021-03-10 ENCOUNTER — Other Ambulatory Visit: Payer: BC Managed Care – PPO
